# Patient Record
Sex: Female | Born: 1995 | Race: White | Hispanic: No | Marital: Single | Smoking: Current every day smoker
Health system: Southern US, Community
[De-identification: ages and names within clinical notes are randomized; demographics above are authoritative.]

## PROBLEM LIST (undated history)

## (undated) DIAGNOSIS — K274 Chronic or unspecified peptic ulcer, site unspecified, with hemorrhage: Secondary | ICD-10-CM

## (undated) DIAGNOSIS — K8020 Calculus of gallbladder without cholecystitis without obstruction: Secondary | ICD-10-CM

## (undated) DIAGNOSIS — K284 Chronic or unspecified gastrojejunal ulcer with hemorrhage: Secondary | ICD-10-CM

---
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
## 2015-01-07 ENCOUNTER — Ambulatory Visit (HOSPITAL_COMMUNITY): Admission: RE | Admit: 2015-01-07 | Payer: 59 | Source: Ambulatory Visit

## 2016-04-19 IMAGING — US US ABDOMEN COMPLETE
1 series · 14 of 25 positions shown · non-contrast
Comparison: None.

CLINICAL DATA: Upper abdominal pain. History of cholelithiasis and
bleeding ulcers.

EXAM:
ULTRASOUND ABDOMEN COMPLETE

[Series 1: us abdomen complete · 0.22mm/px · 14 of 61 slices shown]
[im 1/61]
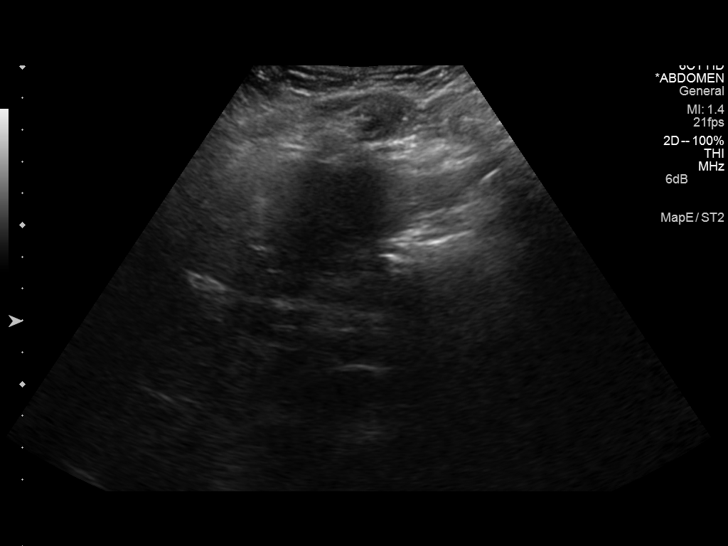
[im 6/61]
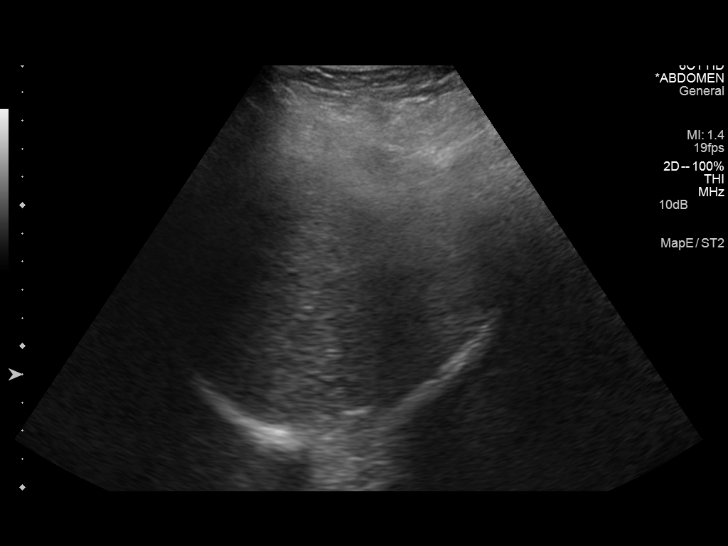
[im 11/61]
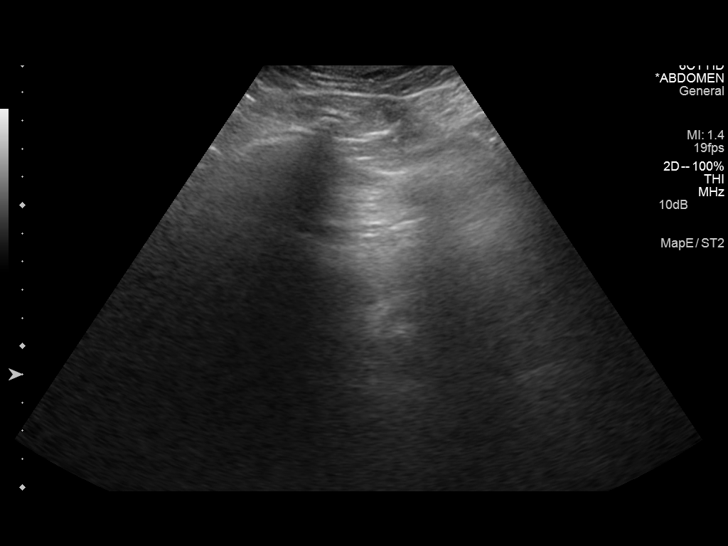
[im 16/61]
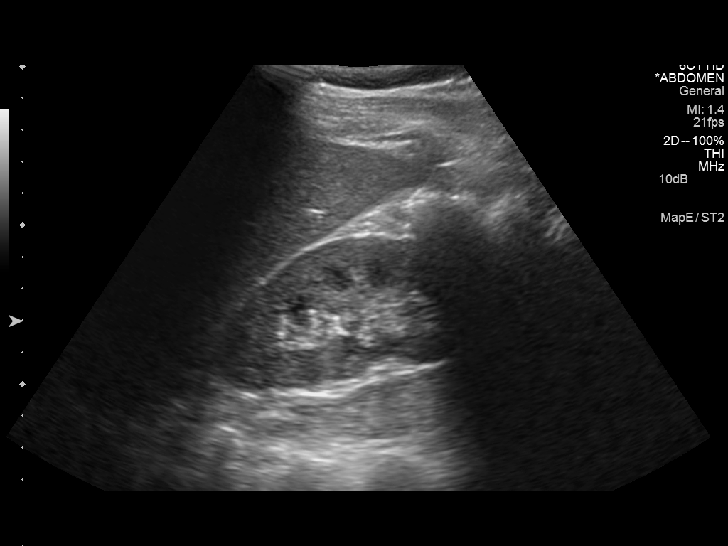
[im 21/61]
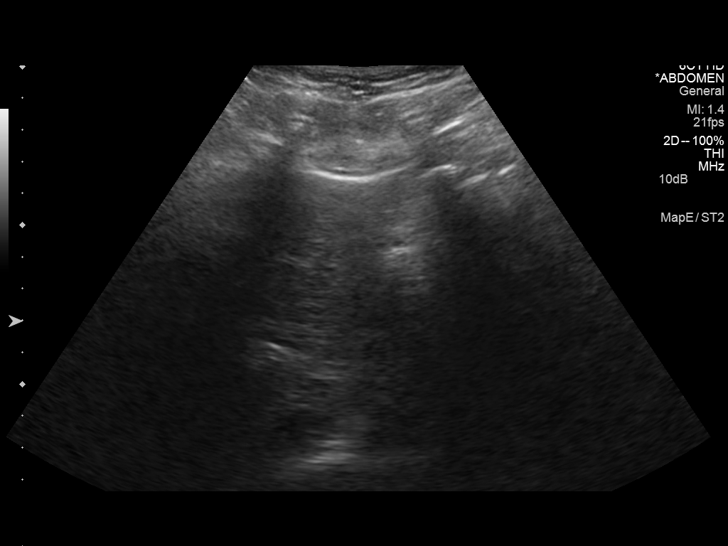
[im 23/61]
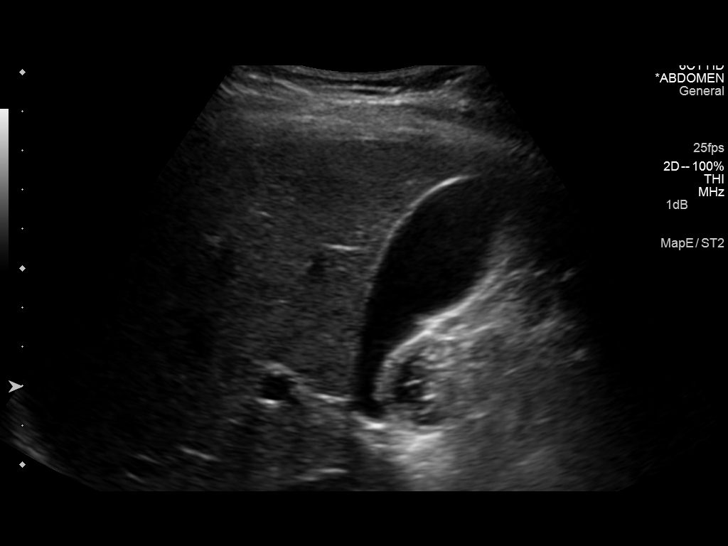
[im 28/61]
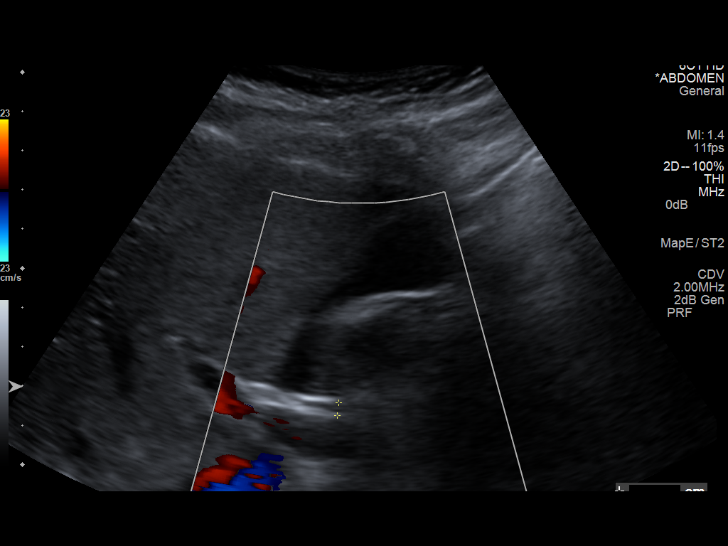
[im 33/61]
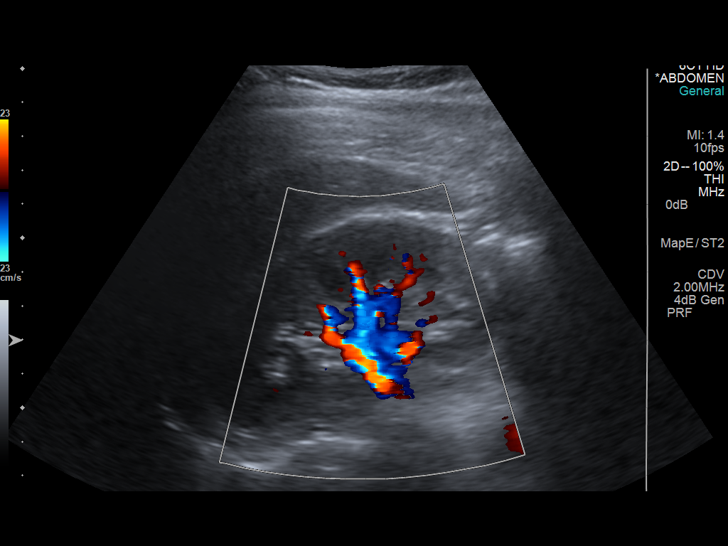
[im 38/61]
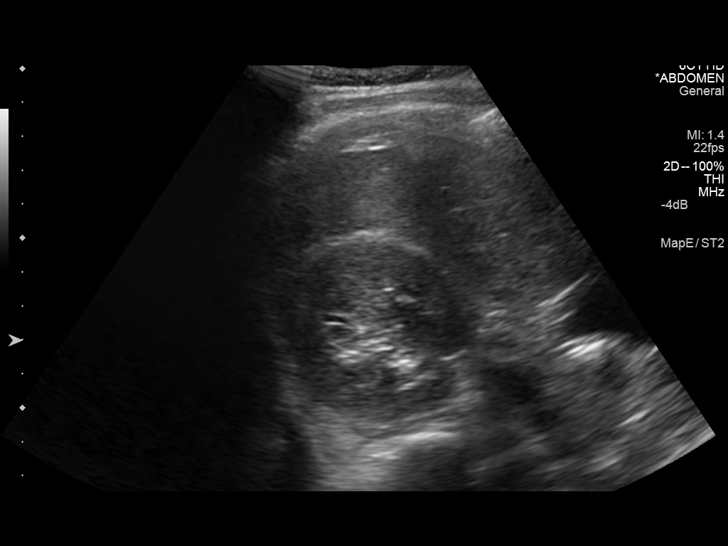
[im 41/61]
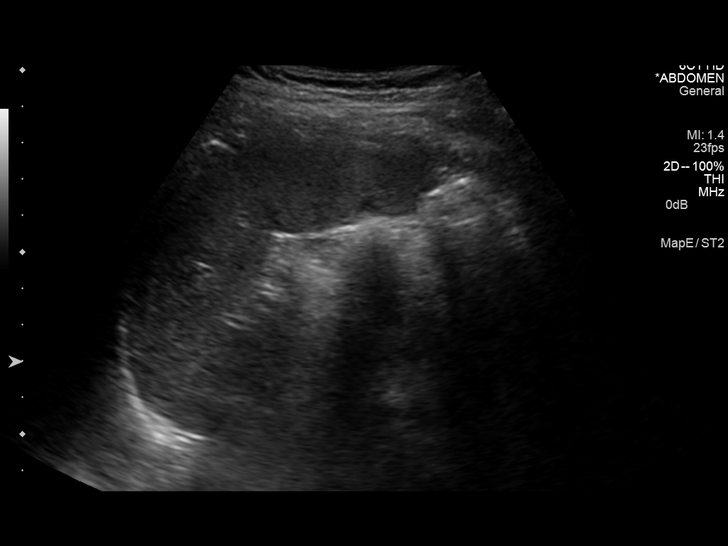
[im 46/61]
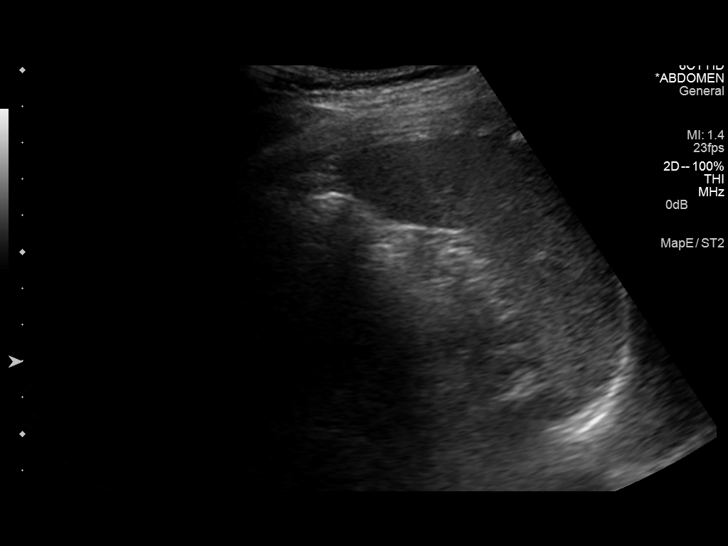
[im 51/61]
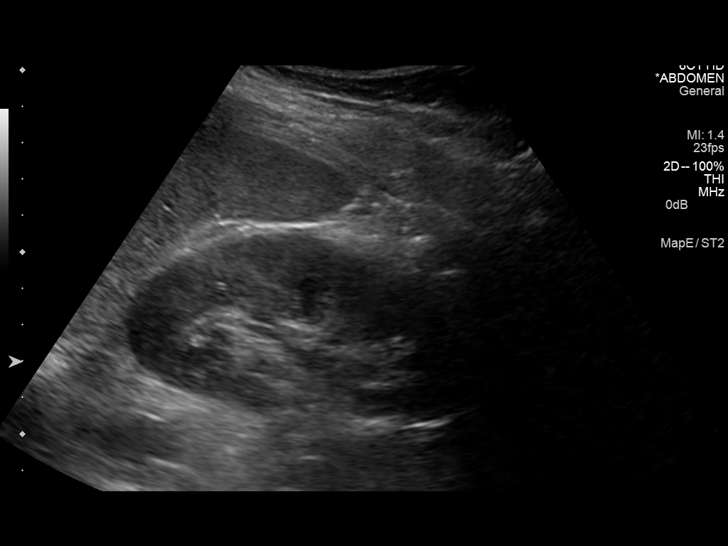
[im 56/61]
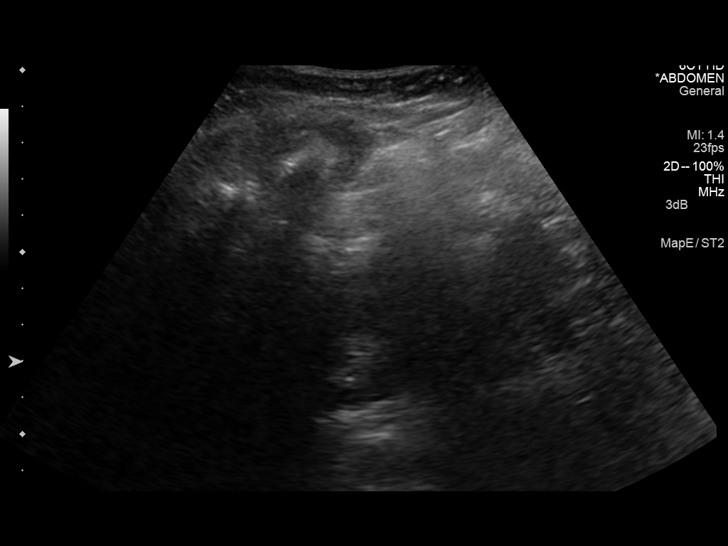
[im 61/61]
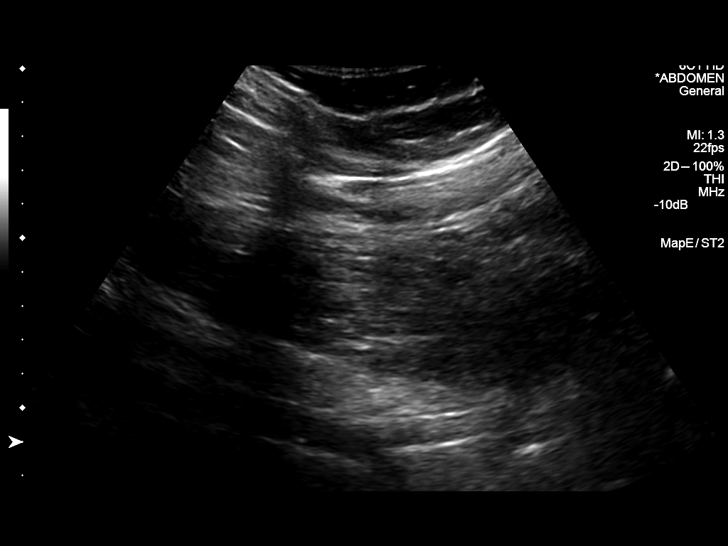

[14 of 25 positions shown; findings below may reference images not displayed]

FINDINGS: Gallbladder: No gallstones or wall thickening visualized. No
sonographic Murphy sign noted.

Common bile duct: Diameter: 3.3 cm

Liver: No focal lesion identified. Within normal limits in
parenchymal echogenicity. Hepatopetal portal vein.

IVC: No abnormality visualized.

Pancreas: Predominately obscured by bowel gas.

Spleen: Size and appearance within normal limits.

Right Kidney: Length: 9.1 cm. Echogenicity within normal limits. No
mass or hydronephrosis visualized.

Left Kidney: Length: 9.8 cm. Echogenicity within normal limits. No
mass or hydronephrosis visualized.

Abdominal aorta: No aneurysm visualized.

Other findings: None.
IMPRESSION: Normal abdominal sonogram.

  By: Wirkom Delegue
